# Patient Record
Sex: Male | Born: 1970 | Race: Black or African American | Hispanic: No | Marital: Married | Smoking: Current every day smoker
Health system: Southern US, Community
[De-identification: ages and names within clinical notes are randomized; demographics above are authoritative.]

## PROBLEM LIST (undated history)

## (undated) DIAGNOSIS — I10 Essential (primary) hypertension: Secondary | ICD-10-CM

---

## 2020-10-20 ENCOUNTER — Observation Stay (HOSPITAL_COMMUNITY): Payer: BC Managed Care – PPO

## 2020-10-20 ENCOUNTER — Encounter (HOSPITAL_COMMUNITY): Payer: Self-pay | Admitting: *Deleted

## 2020-10-20 ENCOUNTER — Observation Stay (HOSPITAL_COMMUNITY)
Admission: EM | Admit: 2020-10-20 | Discharge: 2020-10-22 | Disposition: A | Discharge status: Home / Self Care | Payer: BC Managed Care – PPO | Attending: Internal Medicine | Admitting: Internal Medicine

## 2020-10-20 ENCOUNTER — Other Ambulatory Visit: Payer: Self-pay

## 2020-10-20 DIAGNOSIS — E876 Hypokalemia: Secondary | ICD-10-CM | POA: Diagnosis not present

## 2020-10-20 DIAGNOSIS — E662 Morbid (severe) obesity with alveolar hypoventilation: Secondary | ICD-10-CM | POA: Diagnosis present

## 2020-10-20 DIAGNOSIS — F10230 Alcohol dependence with withdrawal, uncomplicated: Secondary | ICD-10-CM | POA: Diagnosis not present

## 2020-10-20 DIAGNOSIS — I4581 Long QT syndrome: Secondary | ICD-10-CM | POA: Insufficient documentation

## 2020-10-20 DIAGNOSIS — G4733 Obstructive sleep apnea (adult) (pediatric): Secondary | ICD-10-CM | POA: Diagnosis present

## 2020-10-20 DIAGNOSIS — E669 Obesity, unspecified: Secondary | ICD-10-CM | POA: Diagnosis present

## 2020-10-20 DIAGNOSIS — R202 Paresthesia of skin: Principal | ICD-10-CM | POA: Insufficient documentation

## 2020-10-20 DIAGNOSIS — R531 Weakness: Secondary | ICD-10-CM | POA: Diagnosis not present

## 2020-10-20 DIAGNOSIS — R197 Diarrhea, unspecified: Secondary | ICD-10-CM

## 2020-10-20 DIAGNOSIS — Z20822 Contact with and (suspected) exposure to covid-19: Secondary | ICD-10-CM | POA: Diagnosis not present

## 2020-10-20 DIAGNOSIS — I10 Essential (primary) hypertension: Secondary | ICD-10-CM | POA: Diagnosis not present

## 2020-10-20 DIAGNOSIS — Z79899 Other long term (current) drug therapy: Secondary | ICD-10-CM | POA: Diagnosis not present

## 2020-10-20 DIAGNOSIS — F17210 Nicotine dependence, cigarettes, uncomplicated: Secondary | ICD-10-CM | POA: Diagnosis not present

## 2020-10-20 DIAGNOSIS — R062 Wheezing: Secondary | ICD-10-CM | POA: Diagnosis not present

## 2020-10-20 DIAGNOSIS — R9431 Abnormal electrocardiogram [ECG] [EKG]: Secondary | ICD-10-CM | POA: Diagnosis present

## 2020-10-20 DIAGNOSIS — Z6836 Body mass index (BMI) 36.0-36.9, adult: Secondary | ICD-10-CM | POA: Insufficient documentation

## 2020-10-20 HISTORY — DX: Essential (primary) hypertension: I10

## 2020-10-20 LAB — HEPATIC FUNCTION PANEL
ALT: 13 U/L (ref 0–44)
AST: 22 U/L (ref 15–41)
Albumin: 3 g/dL — ABNORMAL LOW (ref 3.5–5.0)
Alkaline Phosphatase: 90 U/L (ref 38–126)
Bilirubin, Direct: 0.1 mg/dL (ref 0.0–0.2)
Indirect Bilirubin: 0.5 mg/dL (ref 0.3–0.9)
Total Bilirubin: 0.6 mg/dL (ref 0.3–1.2)
Total Protein: 6.6 g/dL (ref 6.5–8.1)

## 2020-10-20 LAB — CBC
HCT: 36.8 % — ABNORMAL LOW (ref 39.0–52.0)
Hemoglobin: 12.4 g/dL — ABNORMAL LOW (ref 13.0–17.0)
MCH: 36.2 pg — ABNORMAL HIGH (ref 26.0–34.0)
MCHC: 33.7 g/dL (ref 30.0–36.0)
MCV: 107.3 fL — ABNORMAL HIGH (ref 80.0–100.0)
Platelets: 248 10*3/uL (ref 150–400)
RBC: 3.43 MIL/uL — ABNORMAL LOW (ref 4.22–5.81)
RDW: 15.8 % — ABNORMAL HIGH (ref 11.5–15.5)
WBC: 8 10*3/uL (ref 4.0–10.5)
nRBC: 0.2 % (ref 0.0–0.2)

## 2020-10-20 LAB — BASIC METABOLIC PANEL
Anion gap: 14 (ref 5–15)
BUN: 15 mg/dL (ref 6–20)
CO2: 31 mmol/L (ref 22–32)
Calcium: 5.9 mg/dL — CL (ref 8.9–10.3)
Chloride: 94 mmol/L — ABNORMAL LOW (ref 98–111)
Creatinine, Ser: 1.08 mg/dL (ref 0.61–1.24)
GFR, Estimated: >60 mL/min (ref >60)
Glucose, Bld: 97 mg/dL (ref 70–99)
Potassium: 2.9 mmol/L — ABNORMAL LOW (ref 3.5–5.1)
Sodium: 139 mmol/L (ref 135–145)

## 2020-10-20 LAB — MAGNESIUM: Magnesium: 0.5 mg/dL — CL (ref 1.7–2.4)

## 2020-10-20 LAB — LIPASE, BLOOD: Lipase: 36 U/L (ref 11–51)

## 2020-10-20 MED ORDER — MAGNESIUM SULFATE 2 GM/50ML IV SOLN
2.0000 g | INTRAVENOUS | Status: AC
  Administered 2020-10-20: 2 g via INTRAVENOUS
  Filled 2020-10-20: qty 50

## 2020-10-20 MED ORDER — SODIUM CHLORIDE 0.9 % IV BOLUS
1000.0000 mL | Freq: Once | INTRAVENOUS | Status: AC
  Administered 2020-10-20: 1000 mL via INTRAVENOUS

## 2020-10-20 MED ORDER — CHLORDIAZEPOXIDE HCL 25 MG PO CAPS
25.0000 mg | ORAL_CAPSULE | Freq: Once | ORAL | Status: AC
  Administered 2020-10-20: 25 mg via ORAL
  Filled 2020-10-20: qty 1

## 2020-10-20 MED ORDER — CALCIUM GLUCONATE-NACL 2-0.675 GM/100ML-% IV SOLN
2.0000 g | Freq: Once | INTRAVENOUS | Status: AC
  Administered 2020-10-20: 2000 mg via INTRAVENOUS
  Filled 2020-10-20: qty 100

## 2020-10-20 MED ORDER — ALBUTEROL SULFATE HFA 108 (90 BASE) MCG/ACT IN AERS
4.0000 | INHALATION_SPRAY | Freq: Once | RESPIRATORY_TRACT | Status: AC
  Administered 2020-10-20: 4 via RESPIRATORY_TRACT
  Filled 2020-10-20: qty 6.7

## 2020-10-20 MED ORDER — LOPERAMIDE HCL 2 MG PO CAPS
4.0000 mg | ORAL_CAPSULE | Freq: Once | ORAL | Status: AC
  Administered 2020-10-20: 4 mg via ORAL
  Filled 2020-10-20: qty 2

## 2020-10-20 MED ORDER — POTASSIUM CHLORIDE 10 MEQ/100ML IV SOLN
10.0000 meq | INTRAVENOUS | Status: AC
  Administered 2020-10-20 (×3): 10 meq via INTRAVENOUS
  Filled 2020-10-20 (×3): qty 100

## 2020-10-20 NOTE — ED Notes (Signed)
ED TO INPATIENT HANDOFF REPORT  Name/Age/Gender Joel Campbell 50 y.o. male  Code Status   Home/SNF/Other Home  Chief Complaint Hypocalcemia [E83.51]  Level of Care/Admitting Diagnosis ED Disposition    ED Disposition Condition Comment   Admit  Hospital Area: [facility] [[id number]]  Level of Care: Progressive [102]  Admit to Progressive based on following criteria: MULTISYSTEM THREATS such as stable sepsis, metabolic/electrolyte imbalance with or without encephalopathy that is responding to early treatment.  Admit to Progressive based on following criteria: ACUTE MENTAL DISORDER-RELATED Drug/Alcohol Ingestion/Overdose/Withdrawal, Suicidal Ideation/attempt requiring safety sitter and < Q2h monitoring/assessments, moderate to severe agitation that is managed with medication/sitter, CIWA-Ar score < 20.  Covid Evaluation: Asymptomatic Screening Protocol (No Symptoms)  Diagnosis: Hypocalcemia [275.41.ICD-9-CM]  Admitting Physician: Marinda Elk [[id number]]  Attending Physician: Marinda Elk [[id number]]       Medical History Past Medical History:  Diagnosis Date  . Hypertension     Allergies Not on File  IV Location/Drains/Wounds Patient Lines/Drains/Airways Status    Active Line/Drains/Airways    Name Placement date Placement time Site Days   Peripheral IV 10/20/20 Right Antecubital 10/20/20  --  Antecubital  less than 1   Peripheral IV 10/20/20 Left Antecubital 10/20/20  1851  Antecubital  less than 1          Labs/Imaging Results for orders placed or performed during the hospital encounter of 10/20/20 (from the past 48 hour(s))  Basic metabolic panel     Status: Abnormal   Collection Time: 10/20/20  2:17 PM  Result Value Ref Range   Sodium 139 135 - 145 mmol/L   Potassium 2.9 (L) 3.5 - 5.1 mmol/L   Chloride 94 (L) 98 - 111 mmol/L   CO2 31 22 - 32 mmol/L   Glucose, Bld 97 70 - 99 mg/dL    Comment: Glucose reference range applies only to  samples taken after fasting for at least 8 hours.   BUN 15 6 - 20 mg/dL   Creatinine, Ser 0.96 0.61 - 1.24 mg/dL   Calcium 5.9 (LL) 8.9 - 10.3 mg/dL    Comment: CRITICAL RESULT CALLED TO, READ BACK BY AND VERIFIED WITH: S.WEST, RN AT 1637 ON 03.17.22 BY N.THOMPSON    GFR, Estimated >60 >60 mL/min    Comment: (NOTE) Calculated using the CKD-EPI Creatinine Equation (2021)    Anion gap 14 5 - 15    Comment: Performed at [facility], [address] [place], [place] [address]  CBC     Status: Abnormal   Collection Time: 10/20/20  2:17 PM  Result Value Ref Range   WBC 8.0 4.0 - 10.5 K/uL   RBC 3.43 (L) 4.22 - 5.81 MIL/uL   Hemoglobin 12.4 (L) 13.0 - 17.0 g/dL   HCT 29.4 (L) 76.5 - 46.5 %   MCV 107.3 (H) 80.0 - 100.0 fL   MCH 36.2 (H) 26.0 - 34.0 pg   MCHC 33.7 30.0 - 36.0 g/dL   RDW 03.5 (H) 46.5 - 68.1 %   Platelets 248 150 - 400 K/uL   nRBC 0.2 0.0 - 0.2 %    Comment: Performed at [facility], [address] [place], [place] [address]  Lipase, blood     Status: None   Collection Time: 10/20/20  6:18 PM  Result Value Ref Range   Lipase 36 11 - 51 U/L    Comment: Performed at [facility], [address], [place], [place] [address]  Magnesium  Status: Abnormal   Collection Time: 10/20/20  6:18 PM  Result Value Ref Range   Magnesium 0.5 (LL) 1.7 - 2.4 mg/dL    Comment: CRITICAL RESULT CALLED TO, READ BACK BY AND VERIFIED WITH: B.BROOKS, RN AT 1940 ON 03.17.22 BY N.THOMPSON Performed at [facility], [address], [place], [place] [address]   Hepatic function panel     Status: Abnormal   Collection Time: 10/20/20  6:18 PM  Result Value Ref Range   Total Protein 6.6 6.5 - 8.1 g/dL   Albumin 3.0 (L) 3.5 - 5.0 g/dL   AST 22 15 - 41 U/L   ALT 13 0 - 44 U/L   Alkaline Phosphatase 90 38 - 126 U/L   Total Bilirubin 0.6 0.3 - 1.2 mg/dL   Bilirubin, Direct 0.1 0.0 - 0.2 mg/dL   Indirect Bilirubin 0.5  0.3 - 0.9 mg/dL    Comment: Performed at [facility], [address] [place], [place] [address]   DG Chest Port 1 View  Result Date: 10/20/2020 CLINICAL DATA:  Arm and leg paresthesias. EXAM: PORTABLE CHEST 1 VIEW COMPARISON:  None. FINDINGS: The heart size and mediastinal contours are within normal limits. Both lungs are clear. The visualized skeletal structures are unremarkable. IMPRESSION: No active disease. Electronically Signed   By: Aram Candela M.D.   On: 10/20/2020 19:57    Pending Labs Unresulted Labs (From admission, onward)          Start     Ordered   10/20/20 1941  Brain natriuretic peptide  Add-on,   AD        10/20/20 1940   10/20/20 1941  PTH, intact and calcium  Add-on,   AD       Question:  Release to patient  Answer:  Immediate   10/20/20 1940   10/20/20 1939  SARS CORONAVIRUS 2 (TAT 6-24 HRS) Nasopharyngeal Nasopharyngeal Swab  (Tier 3 - Symptomatic/asymptomatic with Precautions)  Once,   STAT       Question Answer Comment  Is this test for diagnosis or screening Screening   Symptomatic for COVID-19 as defined by CDC No   Hospitalized for COVID-19 No   Admitted to ICU for COVID-19 No   Previously tested for COVID-19 No   Resident in a congregate (group) care setting Unknown   Employed in healthcare setting Unknown   Has patient completed COVID vaccination(s) (2 doses of Pfizer/Moderna 1 dose of [employer]) Unknown      10/20/20 1938          Vitals/Pain Today's Vitals   10/20/20 1900 10/20/20 1915 10/20/20 1937 10/20/20 2030  BP: 97/69  (!) 116/55 125/63  Pulse: 78 81 75 81  Resp: (!) 21 (!) 31 12 18   Temp:      TempSrc:      SpO2: (!) 88% 95% 96% 90%  Weight:      Height:      PainSc:        Isolation Precautions No active isolations  Medications Medications  potassium chloride 10 mEq in 100 mL IVPB (10 mEq Intravenous New Bag/Given 10/20/20 2018)  calcium gluconate 2 g/ 100 mL sodium chloride IVPB (0 g  Intravenous Stopped 10/20/20 2050)  magnesium sulfate IVPB 2 g 50 mL (0 g Intravenous Stopped 10/20/20 2013)  sodium chloride 0.9 % bolus 1,000 mL (0 mLs Intravenous Stopped 10/20/20 2013)  albuterol (VENTOLIN HFA) 108 (90 Base) MCG/ACT inhaler 4 puff (4 puffs Inhalation Given  10/20/20 1859)  chlordiazePOXIDE (LIBRIUM) capsule 25 mg (25 mg Oral Given 10/20/20 1853)  loperamide (IMODIUM) capsule 4 mg (4 mg Oral Given 10/20/20 1853)    Mobility walks with person assist

## 2020-10-20 NOTE — ED Provider Notes (Signed)
[facility]-EMERGENCY DEPT Provider Note   CSN: [id number] Arrival date & time: 10/20/20  1400     History Chief Complaint  Patient presents with   Tingling    Joel Campbell is a 50 y.o. male with a past medical history of alcohol abuse.  The patient began having migrating areas of paresthesia in his legs and arms.  This is consistent with previous episodes of severe electrolyte abnormalities that he has has in the past.  Patient reports that when he has been drinking heavily he gets severe diarrhea which leads to this.  He is currently admitted to [facility] since 10/19/2020.  He continues to have diarrhea.  He is on a Librium taper and has had no episodes, signs or symptoms of withdrawal since then.  The patient has been hospitalized twice before for severe electrolyte disturbances with paresthesia.  He denies cramping, changes in mentation or weakness.  He denies abdominal pain or vomiting.  HPI     Past Medical History:  Diagnosis Date   Hypertension     Patient Active Problem List   Diagnosis Date Noted   Hypokalemia 10/21/2020   Hypomagnesemia 10/21/2020   Alcohol dependence with uncomplicated withdrawal (HCC) 10/21/2020   OSA (obstructive sleep apnea) 10/21/2020   Essential hypertension 10/21/2020   Nicotine dependence, cigarettes, uncomplicated 10/21/2020   Wheezing 10/21/2020   Prolonged QT interval 10/21/2020   Hypocalcemia 10/20/2020    History reviewed. No pertinent surgical history.     Family History  Problem Relation Age of Onset   Heart attack Neg Hx     Social History   Tobacco Use   Smoking status: Current Every Day Smoker    Types: Cigarettes    Start date: 08/2000   Smokeless tobacco: Never Used  Substance Use Topics   Alcohol use: Yes    Home Medications Prior to Admission medications   Medication Sig Start Date End Date Taking? Authorizing Provider  allopurinol (ZYLOPRIM) 300 MG tablet Take 300 mg  by mouth daily. 08/12/20  Yes [provider]  amLODipine (NORVASC) 5 MG tablet Take 5 mg by mouth daily. 08/10/20  Yes [provider]  Calcium Carb-Cholecalciferol (OYSTER SHELL CALCIUM W/D) 500-200 MG-UNIT TABS Take 2 tablets by mouth 2 (two) times daily. 09/21/20  Yes [provider]  chlordiazePOXIDE (LIBRIUM) 25 MG capsule Take 25 mg by mouth 3 (three) times daily as needed for anxiety.   Yes [provider]  cloNIDine (CATAPRES) 0.1 MG tablet Take 0.1 mg by mouth 2 (two) times daily. 09/05/20  Yes [provider]  clotrimazole-betamethasone (LOTRISONE) cream APPLY TO AFFECTED AREA TWICE A DAY 09/08/20  Yes [provider]  FLUoxetine (PROZAC) 20 MG capsule Take 20 mg by mouth daily. 09/09/20  Yes [provider]  hydrALAZINE (APRESOLINE) 25 MG tablet Take 25 mg by mouth 3 (three) times daily. 07/01/20  Yes [provider]  isosorbide dinitrate (ISORDIL) 20 MG tablet Take 20 mg by mouth 3 (three) times daily. 07/04/20  Yes [provider]  KLOR-CON M20 20 MEQ tablet Take 40 mEq by mouth daily. 08/10/20  Yes [provider]  magnesium oxide (MAG-OX) 400 MG tablet Take 2 tablets by mouth daily. 09/21/20  Yes [provider]  metoprolol (TOPROL-XL) 200 MG 24 hr tablet Take 200 mg by mouth daily. 09/09/20  Yes [provider]  Multiple Vitamins-Minerals (MULTIVITAMIN ADULTS PO) Take 1 capsule by mouth daily.   Yes [provider]  omeprazole (PRILOSEC) 20 MG  capsule Take 20 mg by mouth daily. 08/09/20  Yes [provider]  thiamine (VITAMIN B-1) 100 MG tablet Take 100 mg by mouth daily.   Yes [provider]  traZODone (DESYREL) 50 MG tablet Take 50 mg by mouth at bedtime.   Yes [provider]    Allergies    Lisinopril  Review of Systems   Review of Systems Ten systems reviewed and are negative for acute change, except as noted in the HPI.   Physical  Exam Updated Vital Signs BP (!) 153/84    Pulse 76    Temp 98.1 F (36.7 C) (Oral)    Resp 20    Ht 6' (1.829 m)    Wt 122.5 kg    SpO2 97%    BMI 36.63 kg/m   Physical Exam Vitals and nursing note reviewed.  Constitutional:      General: He is not in acute distress.    Appearance: He is well-developed. He is not diaphoretic.  HENT:     Head: Normocephalic and atraumatic.  Eyes:     General: No scleral icterus.    Conjunctiva/sclera: Conjunctivae normal.  Cardiovascular:     Rate and Rhythm: Normal rate and regular rhythm.     Heart sounds: Normal heart sounds.  Pulmonary:     Effort: Pulmonary effort is normal. No respiratory distress.     Breath sounds: Wheezing present.  Abdominal:     Palpations: Abdomen is soft.     Tenderness: There is no abdominal tenderness.  Musculoskeletal:     Cervical back: Normal range of motion and neck supple.     Right lower leg: Edema present.     Left lower leg: Edema present.  Skin:    General: Skin is warm and dry.  Neurological:     Mental Status: He is alert.  Psychiatric:        Behavior: Behavior normal.     ED Results / Procedures / Treatments   Labs (all labs ordered are listed, but only abnormal results are displayed) Labs Reviewed  BASIC METABOLIC PANEL - Abnormal; Notable for the following components:      Result Value   Potassium 2.9 (*)    Chloride 94 (*)    Calcium 5.9 (*)    All other components within normal limits  CBC - Abnormal; Notable for the following components:   RBC 3.43 (*)    Hemoglobin 12.4 (*)    HCT 36.8 (*)    MCV 107.3 (*)    MCH 36.2 (*)    RDW 15.8 (*)    All other components within normal limits  MAGNESIUM - Abnormal; Notable for the following components:   Magnesium 0.5 (*)    All other components within normal limits  HEPATIC FUNCTION PANEL - Abnormal; Notable for the following components:   Albumin 3.0 (*)    All other components within normal limits  VITAMIN D 25 HYDROXY (VIT D  DEFICIENCY, FRACTURES) - Abnormal; Notable for the following components:   Vit D, 25-Hydroxy 11.99 (*)    All other components within normal limits  COMPREHENSIVE METABOLIC PANEL - Abnormal; Notable for the following components:   Potassium 3.0 (*)    Chloride 96 (*)    Glucose, Bld 107 (*)    Calcium 6.3 (*)    Albumin 3.2 (*)    All other components within normal limits  MAGNESIUM - Abnormal; Notable for the following components:   Magnesium 1.2 (*)  All other components within normal limits  PHOSPHORUS - Abnormal; Notable for the following components:   Phosphorus 5.0 (*)    All other components within normal limits  CBC WITH DIFFERENTIAL/PLATELET - Abnormal; Notable for the following components:   RBC 3.35 (*)    Hemoglobin 11.9 (*)    HCT 35.8 (*)    MCV 106.9 (*)    MCH 35.5 (*)    RDW 15.9 (*)    All other components within normal limits  MAGNESIUM - Abnormal; Notable for the following components:   Magnesium 1.5 (*)    All other components within normal limits  CALCIUM - Abnormal; Notable for the following components:   Calcium 6.5 (*)    All other components within normal limits  SARS CORONAVIRUS 2 (TAT 6-24 HRS)  LIPASE, BLOOD  BRAIN NATRIURETIC PEPTIDE  HIV ANTIBODY (ROUTINE TESTING W REFLEX)  PTH, INTACT AND CALCIUM  I-STAT CHEM 8, ED    EKG EKG Interpretation  Date/Time:  Thursday October 20 2020 18:14:06 EDT Ventricular Rate:  81 PR Interval:    QRS Duration: 99 QT Interval:  440 QTC Calculation: 511 R Axis:   60 Text Interpretation: Sinus rhythm Prolonged QT interval No STEMI Confirmed by Alvester Chou [phone]) on 10/20/2020 6:22:01 PM   Radiology DG Chest Port 1 View  Result Date: 10/20/2020 CLINICAL DATA:  Arm and leg paresthesias. EXAM: PORTABLE CHEST 1 VIEW COMPARISON:  None. FINDINGS: The heart size and mediastinal contours are within normal limits. Both lungs are clear. The visualized skeletal structures are unremarkable. IMPRESSION: No  active disease. Electronically Signed   By: Aram Candela M.D.   On: 10/20/2020 19:57    Procedures .Critical Care Performed by: Arthor Captain, PA-C Authorized by: Arthor Captain, PA-C   Critical care provider statement:    Critical care time (minutes):  50   Critical care time was exclusive of:  Separately billable procedures and treating other patients   Critical care was necessary to treat or prevent imminent or life-threatening deterioration of the following conditions:  Metabolic crisis   Critical care was time spent personally by me on the following activities:  Discussions with consultants, evaluation of patient's response to treatment, examination of patient, ordering and performing treatments and interventions, ordering and review of laboratory studies, ordering and review of radiographic studies, pulse oximetry, re-evaluation of patient's condition, obtaining history from patient or surrogate and review of old charts     Medications Ordered in ED Medications  albuterol (PROVENTIL) (2.5 MG/3ML) 0.083% nebulizer solution 2.5 mg (2.5 mg Nebulization Given 10/21/20 0335)  chlordiazePOXIDE (LIBRIUM) capsule 10 mg (10 mg Oral Given 10/21/20 1338)  chlordiazePOXIDE (LIBRIUM) capsule 5 mg (has no administration in time range)  LORazepam (ATIVAN) tablet 1-4 mg (has no administration in time range)    Or  LORazepam (ATIVAN) injection 1-4 mg (has no administration in time range)  thiamine tablet 100 mg (100 mg Oral Given 10/21/20 1008)    Or  thiamine (B-1) injection 100 mg ( Intravenous See Alternative 10/21/20 1008)  folic acid (FOLVITE) tablet 1 mg (1 mg Oral Given 10/21/20 1009)  multivitamin with minerals tablet 1 tablet (1 tablet Oral Given 10/21/20 1008)  amLODipine (NORVASC) tablet 5 mg (5 mg Oral Given 10/21/20 1015)  cloNIDine (CATAPRES) tablet 0.1 mg (0.1 mg Oral Given 10/21/20 1018)  hydrALAZINE (APRESOLINE) tablet 25 mg (25 mg Oral Given 10/21/20 1755)  isosorbide dinitrate  (ISORDIL) tablet 20 mg (20 mg Oral Given 10/21/20 1754)  metoprolol succinate (TOPROL-XL) 24  hr tablet 200 mg (200 mg Oral Not Given 10/21/20 1018)  FLUoxetine (PROZAC) capsule 20 mg (20 mg Oral Given 10/21/20 1009)  traZODone (DESYREL) tablet 50 mg (has no administration in time range)  pantoprazole (PROTONIX) EC tablet 40 mg (40 mg Oral Given 10/21/20 1008)  hydrALAZINE (APRESOLINE) injection 10 mg (has no administration in time range)  enoxaparin (LOVENOX) injection 60 mg (60 mg Subcutaneous Given 10/21/20 1009)  acetaminophen (TYLENOL) tablet 650 mg (has no administration in time range)    Or  acetaminophen (TYLENOL) suppository 650 mg (has no administration in time range)  polyethylene glycol (MIRALAX / GLYCOLAX) packet 17 g (has no administration in time range)  potassium chloride SA (KLOR-CON) CR tablet 40 mEq (40 mEq Oral Given 10/21/20 1008)  calcium gluconate 3 g in sodium chloride 0.9 % 100 mL IVPB (3 g Intravenous New Bag/Given 10/21/20 1754)  calcium gluconate 2 g/ 100 mL sodium chloride IVPB (0 g Intravenous Stopped 10/20/20 2050)  potassium chloride 10 mEq in 100 mL IVPB (0 mEq Intravenous Stopped 10/21/20 0000)  magnesium sulfate IVPB 2 g 50 mL (0 g Intravenous Stopped 10/20/20 2013)  sodium chloride 0.9 % bolus 1,000 mL (0 mLs Intravenous Stopped 10/20/20 2013)  albuterol (VENTOLIN HFA) 108 (90 Base) MCG/ACT inhaler 4 puff (4 puffs Inhalation Given 10/20/20 1859)  chlordiazePOXIDE (LIBRIUM) capsule 25 mg (25 mg Oral Given 10/20/20 1853)  loperamide (IMODIUM) capsule 4 mg (4 mg Oral Given 10/20/20 1853)  magnesium sulfate IVPB 2 g 50 mL (0 g Intravenous Stopping Infusion hung by another clincian 10/21/20 0844)  potassium chloride SA (KLOR-CON) CR tablet 40 mEq (40 mEq Oral Given 10/21/20 0254)  chlordiazePOXIDE (LIBRIUM) capsule 25 mg (25 mg Oral Given 10/21/20 0351)  calcium gluconate 2 g/ 100 mL sodium chloride IVPB (0 mg Intravenous Stopped 10/21/20 1329)  magnesium sulfate IVPB 2 g 50 mL (0  g Intravenous Stopping Infusion hung by another clincian 10/21/20 0844)  magnesium sulfate IVPB 2 g 50 mL (0 g Intravenous Stopped 10/21/20 1328)  calcium gluconate 1 g/ 50 mL sodium chloride IVPB (0 mg Intravenous Stopped 10/21/20 1328)  magnesium sulfate IVPB 4 g 100 mL (4 g Intravenous New Bag/Given 10/21/20 1339)    ED Course  I have reviewed the triage vital signs and the nursing notes.  Pertinent labs & imaging results that were available during my care of the patient were reviewed by me and considered in my medical decision making (see chart for details).  Clinical Course as of 10/21/20 1827  Thu Oct 20, 2020  1807 Librium Taper is as follows 3/17- 25mg  5:00p, 10:00p 3/18 -25 mg 8:00a, 12:00p; 10mg  5p,10p 3/19- 10mg  8a/12p; 5mg   5p/10p 3/20- 5mg  8a/ 12p  [AH]  Fri Oct 21, 2020  1827 MAP (mmHg): 75 [AH]    Clinical Course User Index [AH] , PA-C   MDM Rules/Calculators/A&P                          Patient here with hx of ETOH abuse,  Final Clinical Impression(s) / ED Diagnoses Final diagnoses:  Paresthesia  Hypocalcemia  Hypokalemia  Prolonged Q-T interval on ECG  Diarrhea, unspecified type   CC: paresthesia VS:  10/20/20 1411 98 F (36.7 C) 86 18 124/62 91 % --    is gathered by patient and EMR, MAR and paperwork sent by [facility] And phone call with [facility] staff. Previous records obtained and reviewed. DDX:The patient's complaint of paresthesia  involves an extensive number of diagnostic and treatment options, and is a complaint that carries with it a high risk of complications, morbidity, and potential mortality. Given the large differential diagnosis, medical decision making is of high complexity. The differential diagnosis of paresthesias includes but is not limited XB:JYNWGNFAOZto:Alcoholism, diabetic neuropathy, es mellitus, entrapment neuropathy, eg, carpal tunnel syndrome, tarsal tunnel syndrome, meralgia paresthetica) hypocalcemia, multiple  sclerosis, spinal cord lesion, nerve root compression, herpes zoster, transient ischemic attack, Guillain-Barr syndrome, trigeminal neuralgia, migraine, partial seizure, reflex sympathetic dystrophy, thoracic outlet syndrome, brachial plexus neuropathy. Labs: I ordered reviewed and interpreted labs which include CBC-macrocytic anemia Bmp- K of 2.9 and severe low Ca at 5.9  Lipase wnl Magnesium- Severely low at 0.5  Hepatic fx panel- pending Imaging: pendng cxr. EKG:nsr with prolonged QTc of 511  Consults:Dr. Shaloub for admission  HYQ:MVHQIONDM:patient here with SEVERE  Electrolyte derangement and paresthesia. I have ordered Calcium gluconate, magnesium, and potassium IV for  Correction and oral imodium for continued [employer].eediarrhea.mild hypoxia with wheezing- copd v. edema- I have ordered albuterol. I have continued librium taper. Patient has no evidence of sxs of withdrawal. Patient will be admitted.  Patient disposition:The patient appears reasonably stabilized for admission considering the current resources, flow, and capabilities available in the ED at this time, and I doubt any other [facility] requiring further screening and/or treatment in the ED prior to admission.        Rx / DC Orders ED Discharge Orders    None       Arthor CaptainHarris, Daily Doe, PA-C 10/21/20 1835    Terald Sleeperrifan, Matthew J, MD 10/22/20 1249

## 2020-10-20 NOTE — ED Triage Notes (Signed)
Per EMS, pt from [facility] x 2 days. Over last 3 days has been experiencing paresthesia in arms and legs-- numbness, tingling. He states symptoms feel like he has a problem with electrolytes and needs to go to hospital.  BP 80/46. Given 500cc bolus. Now 130/80 HR 88 RR 16 SpO2 98% CBG 90 Temp 98.3

## 2020-10-20 NOTE — Plan of Care (Signed)

## 2020-10-21 ENCOUNTER — Encounter (HOSPITAL_COMMUNITY): Payer: Self-pay | Admitting: Internal Medicine

## 2020-10-21 DIAGNOSIS — E662 Morbid (severe) obesity with alveolar hypoventilation: Secondary | ICD-10-CM

## 2020-10-21 DIAGNOSIS — R197 Diarrhea, unspecified: Secondary | ICD-10-CM

## 2020-10-21 DIAGNOSIS — E876 Hypokalemia: Secondary | ICD-10-CM | POA: Diagnosis not present

## 2020-10-21 DIAGNOSIS — R062 Wheezing: Secondary | ICD-10-CM

## 2020-10-21 DIAGNOSIS — F10230 Alcohol dependence with withdrawal, uncomplicated: Secondary | ICD-10-CM | POA: Diagnosis not present

## 2020-10-21 DIAGNOSIS — I10 Essential (primary) hypertension: Secondary | ICD-10-CM | POA: Diagnosis not present

## 2020-10-21 DIAGNOSIS — R9431 Abnormal electrocardiogram [ECG] [EKG]: Secondary | ICD-10-CM

## 2020-10-21 DIAGNOSIS — R202 Paresthesia of skin: Principal | ICD-10-CM

## 2020-10-21 DIAGNOSIS — G4733 Obstructive sleep apnea (adult) (pediatric): Secondary | ICD-10-CM

## 2020-10-21 DIAGNOSIS — Z6836 Body mass index (BMI) 36.0-36.9, adult: Secondary | ICD-10-CM

## 2020-10-21 DIAGNOSIS — F17210 Nicotine dependence, cigarettes, uncomplicated: Secondary | ICD-10-CM | POA: Diagnosis present

## 2020-10-21 DIAGNOSIS — E669 Obesity, unspecified: Secondary | ICD-10-CM | POA: Diagnosis present

## 2020-10-21 LAB — CBC WITH DIFFERENTIAL/PLATELET
Abs Immature Granulocytes: 0.04 10*3/uL (ref 0.00–0.07)
Basophils Absolute: 0 10*3/uL (ref 0.0–0.1)
Basophils Relative: 1 %
Eosinophils Absolute: 0.2 10*3/uL (ref 0.0–0.5)
Eosinophils Relative: 2 %
HCT: 35.8 % — ABNORMAL LOW (ref 39.0–52.0)
Hemoglobin: 11.9 g/dL — ABNORMAL LOW (ref 13.0–17.0)
Immature Granulocytes: 1 %
Lymphocytes Relative: 25 %
Lymphs Abs: 1.8 10*3/uL (ref 0.7–4.0)
MCH: 35.5 pg — ABNORMAL HIGH (ref 26.0–34.0)
MCHC: 33.2 g/dL (ref 30.0–36.0)
MCV: 106.9 fL — ABNORMAL HIGH (ref 80.0–100.0)
Monocytes Absolute: 0.4 10*3/uL (ref 0.1–1.0)
Monocytes Relative: 5 %
Neutro Abs: 4.9 10*3/uL (ref 1.7–7.7)
Neutrophils Relative %: 66 %
Platelets: 226 10*3/uL (ref 150–400)
RBC: 3.35 MIL/uL — ABNORMAL LOW (ref 4.22–5.81)
RDW: 15.9 % — ABNORMAL HIGH (ref 11.5–15.5)
WBC: 7.3 10*3/uL (ref 4.0–10.5)
nRBC: 0 % (ref 0.0–0.2)

## 2020-10-21 LAB — COMPREHENSIVE METABOLIC PANEL
ALT: 12 U/L (ref 0–44)
AST: 20 U/L (ref 15–41)
Albumin: 3.2 g/dL — ABNORMAL LOW (ref 3.5–5.0)
Alkaline Phosphatase: 86 U/L (ref 38–126)
Anion gap: 12 (ref 5–15)
BUN: 13 mg/dL (ref 6–20)
CO2: 31 mmol/L (ref 22–32)
Calcium: 6.3 mg/dL — CL (ref 8.9–10.3)
Chloride: 96 mmol/L — ABNORMAL LOW (ref 98–111)
Creatinine, Ser: 0.87 mg/dL (ref 0.61–1.24)
GFR, Estimated: >60 mL/min (ref >60)
Glucose, Bld: 107 mg/dL — ABNORMAL HIGH (ref 70–99)
Potassium: 3 mmol/L — ABNORMAL LOW (ref 3.5–5.1)
Sodium: 139 mmol/L (ref 135–145)
Total Bilirubin: 0.8 mg/dL (ref 0.3–1.2)
Total Protein: 6.6 g/dL (ref 6.5–8.1)

## 2020-10-21 LAB — MAGNESIUM
Magnesium: 1.2 mg/dL — ABNORMAL LOW (ref 1.7–2.4)
Magnesium: 1.5 mg/dL — ABNORMAL LOW (ref 1.7–2.4)

## 2020-10-21 LAB — VITAMIN D 25 HYDROXY (VIT D DEFICIENCY, FRACTURES): Vit D, 25-Hydroxy: 11.99 ng/mL — ABNORMAL LOW (ref 30–100)

## 2020-10-21 LAB — PHOSPHORUS: Phosphorus: 5 mg/dL — ABNORMAL HIGH (ref 2.5–4.6)

## 2020-10-21 LAB — SARS CORONAVIRUS 2 (TAT 6-24 HRS): SARS Coronavirus 2: NEGATIVE

## 2020-10-21 LAB — BRAIN NATRIURETIC PEPTIDE: B Natriuretic Peptide: 41.1 pg/mL (ref 0.0–100.0)

## 2020-10-21 LAB — CALCIUM: Calcium: 6.5 mg/dL — ABNORMAL LOW (ref 8.9–10.3)

## 2020-10-21 LAB — HIV ANTIBODY (ROUTINE TESTING W REFLEX): HIV Screen 4th Generation wRfx: NONREACTIVE

## 2020-10-21 MED ORDER — CHLORDIAZEPOXIDE HCL 5 MG PO CAPS
25.0000 mg | ORAL_CAPSULE | Freq: Once | ORAL | Status: AC
  Administered 2020-10-21: 25 mg via ORAL
  Filled 2020-10-21: qty 5

## 2020-10-21 MED ORDER — FOLIC ACID 1 MG PO TABS
1.0000 mg | ORAL_TABLET | Freq: Every day | ORAL | Status: DC
  Administered 2020-10-21 – 2020-10-22 (×2): 1 mg via ORAL
  Filled 2020-10-21 (×2): qty 1

## 2020-10-21 MED ORDER — ISOSORBIDE DINITRATE 20 MG PO TABS
20.0000 mg | ORAL_TABLET | Freq: Three times a day (TID) | ORAL | Status: DC
  Administered 2020-10-21 – 2020-10-22 (×5): 20 mg via ORAL
  Filled 2020-10-21 (×6): qty 1

## 2020-10-21 MED ORDER — SODIUM CHLORIDE 0.9 % IV SOLN
3.0000 g | Freq: Once | INTRAVENOUS | Status: AC
  Administered 2020-10-21: 3 g via INTRAVENOUS
  Filled 2020-10-21: qty 30

## 2020-10-21 MED ORDER — HYDRALAZINE HCL 25 MG PO TABS
25.0000 mg | ORAL_TABLET | Freq: Three times a day (TID) | ORAL | Status: DC
  Administered 2020-10-21 – 2020-10-22 (×5): 25 mg via ORAL
  Filled 2020-10-21 (×5): qty 1

## 2020-10-21 MED ORDER — HYDRALAZINE HCL 20 MG/ML IJ SOLN: 10.0000 mg | Freq: Four times a day (QID) | INTRAMUSCULAR | Status: DC | PRN

## 2020-10-21 MED ORDER — MAGNESIUM SULFATE 2 GM/50ML IV SOLN
2.0000 g | Freq: Once | INTRAVENOUS | Status: AC
  Administered 2020-10-21: 2 g via INTRAVENOUS
  Filled 2020-10-21: qty 50

## 2020-10-21 MED ORDER — PANTOPRAZOLE SODIUM 40 MG PO TBEC
40.0000 mg | DELAYED_RELEASE_TABLET | Freq: Every day | ORAL | Status: DC
  Administered 2020-10-21 – 2020-10-22 (×2): 40 mg via ORAL
  Filled 2020-10-21 (×2): qty 1

## 2020-10-21 MED ORDER — THIAMINE HCL 100 MG PO TABS
100.0000 mg | ORAL_TABLET | Freq: Every day | ORAL | Status: DC
  Administered 2020-10-21 – 2020-10-22 (×2): 100 mg via ORAL
  Filled 2020-10-21 (×2): qty 1

## 2020-10-21 MED ORDER — ACETAMINOPHEN 650 MG RE SUPP: 650.0000 mg | Freq: Four times a day (QID) | RECTAL | Status: DC | PRN

## 2020-10-21 MED ORDER — POTASSIUM CHLORIDE CRYS ER 20 MEQ PO TBCR
40.0000 meq | EXTENDED_RELEASE_TABLET | Freq: Two times a day (BID) | ORAL | Status: DC
  Administered 2020-10-21 – 2020-10-22 (×3): 40 meq via ORAL
  Filled 2020-10-21 (×3): qty 2

## 2020-10-21 MED ORDER — ALBUTEROL SULFATE (2.5 MG/3ML) 0.083% IN NEBU
2.5000 mg | INHALATION_SOLUTION | RESPIRATORY_TRACT | Status: DC | PRN
  Administered 2020-10-21: 2.5 mg via RESPIRATORY_TRACT
  Filled 2020-10-21: qty 3

## 2020-10-21 MED ORDER — ENOXAPARIN SODIUM 60 MG/0.6ML ~~LOC~~ SOLN
60.0000 mg | SUBCUTANEOUS | Status: DC
  Administered 2020-10-21 – 2020-10-22 (×2): 60 mg via SUBCUTANEOUS
  Filled 2020-10-21 (×2): qty 0.6

## 2020-10-21 MED ORDER — CALCIUM GLUCONATE-NACL 1-0.675 GM/50ML-% IV SOLN
1.0000 g | Freq: Once | INTRAVENOUS | Status: AC
  Administered 2020-10-21: 1000 mg via INTRAVENOUS
  Filled 2020-10-21: qty 50

## 2020-10-21 MED ORDER — CHLORDIAZEPOXIDE HCL 5 MG PO CAPS
10.0000 mg | ORAL_CAPSULE | Freq: Four times a day (QID) | ORAL | Status: AC
  Administered 2020-10-21 – 2020-10-22 (×5): 10 mg via ORAL
  Filled 2020-10-21 (×5): qty 2

## 2020-10-21 MED ORDER — POLYETHYLENE GLYCOL 3350 17 G PO PACK: 17.0000 g | PACK | Freq: Every day | ORAL | Status: DC | PRN

## 2020-10-21 MED ORDER — CLONIDINE HCL 0.1 MG PO TABS
0.1000 mg | ORAL_TABLET | Freq: Two times a day (BID) | ORAL | Status: DC
  Administered 2020-10-21 – 2020-10-22 (×3): 0.1 mg via ORAL
  Filled 2020-10-21 (×3): qty 1

## 2020-10-21 MED ORDER — FLUOXETINE HCL 20 MG PO CAPS
20.0000 mg | ORAL_CAPSULE | Freq: Every day | ORAL | Status: DC
  Administered 2020-10-21 – 2020-10-22 (×2): 20 mg via ORAL
  Filled 2020-10-21 (×2): qty 1

## 2020-10-21 MED ORDER — LORAZEPAM 1 MG PO TABS
1.0000 mg | ORAL_TABLET | ORAL | Status: DC | PRN
  Administered 2020-10-21: 2 mg via ORAL
  Filled 2020-10-21: qty 2

## 2020-10-21 MED ORDER — ACETAMINOPHEN 325 MG PO TABS: 650.0000 mg | ORAL_TABLET | Freq: Four times a day (QID) | ORAL | Status: DC | PRN

## 2020-10-21 MED ORDER — MAGNESIUM SULFATE 4 GM/100ML IV SOLN
4.0000 g | Freq: Once | INTRAVENOUS | Status: AC
  Administered 2020-10-21: 4 g via INTRAVENOUS
  Filled 2020-10-21: qty 100

## 2020-10-21 MED ORDER — AMLODIPINE BESYLATE 5 MG PO TABS
5.0000 mg | ORAL_TABLET | Freq: Every day | ORAL | Status: DC
  Administered 2020-10-21 – 2020-10-22 (×2): 5 mg via ORAL
  Filled 2020-10-21 (×2): qty 1

## 2020-10-21 MED ORDER — CHLORDIAZEPOXIDE HCL 5 MG PO CAPS: 5.0000 mg | ORAL_CAPSULE | Freq: Four times a day (QID) | ORAL | Status: DC

## 2020-10-21 MED ORDER — METOPROLOL SUCCINATE ER 100 MG PO TB24
200.0000 mg | ORAL_TABLET | Freq: Every day | ORAL | Status: DC
  Administered 2020-10-22: 200 mg via ORAL
  Filled 2020-10-21 (×2): qty 2

## 2020-10-21 MED ORDER — CALCIUM GLUCONATE-NACL 2-0.675 GM/100ML-% IV SOLN
2.0000 g | Freq: Once | INTRAVENOUS | Status: AC
  Administered 2020-10-21: 2000 mg via INTRAVENOUS
  Filled 2020-10-21: qty 100

## 2020-10-21 MED ORDER — ADULT MULTIVITAMIN W/MINERALS CH
1.0000 | ORAL_TABLET | Freq: Every day | ORAL | Status: DC
  Administered 2020-10-21 – 2020-10-22 (×2): 1 via ORAL
  Filled 2020-10-21 (×2): qty 1

## 2020-10-21 MED ORDER — TRAZODONE HCL 50 MG PO TABS
50.0000 mg | ORAL_TABLET | Freq: Every day | ORAL | Status: DC
  Administered 2020-10-21: 50 mg via ORAL
  Filled 2020-10-21: qty 1

## 2020-10-21 MED ORDER — POTASSIUM CHLORIDE CRYS ER 20 MEQ PO TBCR
40.0000 meq | EXTENDED_RELEASE_TABLET | Freq: Once | ORAL | Status: AC
  Administered 2020-10-21: 40 meq via ORAL
  Filled 2020-10-21: qty 2

## 2020-10-21 MED ORDER — LORAZEPAM 2 MG/ML IJ SOLN: 1.0000 mg | INTRAMUSCULAR | Status: DC | PRN

## 2020-10-21 MED ORDER — THIAMINE HCL 100 MG/ML IJ SOLN
100.0000 mg | Freq: Every day | INTRAMUSCULAR | Status: DC
  Filled 2020-10-21: qty 2

## 2020-10-21 NOTE — Progress Notes (Signed)
PROGRESS NOTE    Joel Campbell  OIN:[id number] DOB: September 14, 1970 DOA: 10/20/2020 PCP: System, Provider Not In     Brief Narrative:  50 year old BM PMHx ETOH abuse, gout, HTN, nicotine dependence, obesity OSA, obesity hypoventilation syndrome   Presents to [facility] emergency department from [facility] rehab facility with complaints of paresthesias.  Patient explains that he has had a longstanding history of extremely heavy alcohol use.  In the past several months, after the patient has extremely heavy drinking binges he begins to experience bouts of frequent watery diarrhea.  Shortly thereafter he begins to experience severe paresthesias.  The last time this happened prompted the patient to present to [facility] where he was hospitalized in observation status overnight and had multiple severe electrolyte abnormalities managed including hypocalcemia hypomagnesia and hypokalemia.  Upon discharge, the patient felt back to baseline but explains that this past weekend he had another weekend of extremely heavy drinking.  By Monday he began to develop severe watery diarrhea once again and by Tuesday he began to develop severe paresthesias.  She complains of associated generalized weakness as well.  At this point, the patient wanted to see striking alcohol together and presented to [facility] and was admitted on Wednesday for detox.  Unfortunately, patient's severe paresthesias persisted in the first 24 hours of his stay there prompting the patient to eventually present to [facility] emergency department for evaluation.  Upon further questioning patient denies fever, cough, shortness of breath, recent travel or confirmed contact with COVID-19 infection.  Upon evaluation in the emergency department here patient was found to have severe hypokalemia with potassium of 2.9, hypocalcemia with calcium of 5.9 and severe hypomagnesemia at 0.5.  Electrolyte  repletion was initiated but due to patient's persistent neurologic symptoms the hospitalist group was called to assess the patient for admission to the hospital.   Subjective: A/O x4, patient states drinks 1 pint of vodka per day and Boot Legger x10/day.   Assessment & Plan: Covid vaccination; vaccinated 3/3   Principal Problem:   Hypocalcemia Active Problems:   Hypokalemia   Hypomagnesemia   Alcohol dependence with uncomplicated withdrawal (HCC)   OSA (obstructive sleep apnea)   Essential hypertension   Nicotine dependence, cigarettes, uncomplicated   Wheezing   Prolonged QT interval   Obesity hypoventilation syndrome (HCC)   Obese  EtOH abuse -Patient very concerned that he will lose his bed at [facility] rehab facility -Presented with paresthesia and weakness secondary to electrolyte abnormalities, secondary to binge drinking of 1 pint of vodka per day and Boot Legger x10/day -States absolutely wants to stop drinking -CIWA protocol -Hopefully will be able to discharge patient on 3/19 would discharge on Librium that was initiated at [facility] rehab facility  Paresthesia/Weakness -Correct underlying electrolyte abnormalities -3/18 states appear to have resolved  Hypokalemia -Potassium goal> 4 -3/18 K-Dur 40 mEq x2   Hypomagnesmia -magnesium goal> 2 -3/18 magnesium IV 4 g  Hypocalcemia -3/18 calcium gluconate 3 g  Diarrhea -Resolved  Essential HTN -Amlodipine 5 mg daily -Clonidine 0.1 mg BID -Hydralazine PRN -Isosorbide dinitrate 20 mg TID -Toprol 200 mg daily  Prolonged QT interval -QT prolongation with QTC of 511 on admission -3/19 repeat EKG pending -May have to discontinue trazodone and Prozac  Nicotine dependence -Counseled on cessation -  OSA/Obesity Hypoventilation syndrome -Does not wish to initiate CPAP  Obese     DVT prophylaxis: Lovenox Code Status: Full Family Communication:  Status is: Inpatient    Dispo:  The  patient is from: Home              Anticipated d/c is to: [facility] rehab facility              Anticipated d/c date is: 3/19              Patient currently unstable      Consultants:    Procedures/Significant Events:    I have personally reviewed and interpreted all radiology studies and my findings are as above.  VENTILATOR SETTINGS:    Cultures   Antimicrobials:    Devices    LINES / TUBES:      Continuous Infusions:    Objective: Vitals:   10/21/20 0940 10/21/20 1015 10/21/20 1236 10/21/20 1755  BP: 129/76 132/76 120/72 (!) 153/84  Pulse: 83  76   Resp:   20   Temp:   98.1 F (36.7 C)   TempSrc:   Oral   SpO2: 95%  97%   Weight:      Height:        Intake/Output Summary (Last 24 hours) at 10/21/2020 1903 Last data filed at 10/21/2020 1900 Gross per 24 hour  Intake 2945.41 ml  Output 900 ml  Net 2045.41 ml   Filed Weights   10/20/20 1812 10/20/20 2145  Weight: 118.8 kg 122.5 kg    Examination:  General: A/O x4 No acute respiratory distress Eyes: negative scleral hemorrhage, negative anisocoria, negative icterus ENT: Negative Runny nose, negative gingival bleeding, Neck:  Negative scars, masses, torticollis, lymphadenopathy, JVD Lungs: Clear to auscultation bilaterally without wheezes or crackles Cardiovascular: Regular rate and rhythm without murmur gallop or rub normal S1 and S2 Abdomen: OBESE, negative abdominal pain, nondistended, positive soft, bowel sounds, no rebound, no ascites, no appreciable mass Extremities: No significant cyanosis, clubbing, or edema bilateral lower extremities Skin: Negative rashes, lesions, ulcers Psychiatric:  Negative depression, negative anxiety, negative fatigue, negative mania  Central nervous system:  Cranial nerves II through XII intact, tongue/uvula midline, all extremities muscle strength 5/5, sensation intact throughout,negative dysarthria, negative expressive aphasia, negative receptive  aphasia.  .     Data Reviewed: Care during the described time interval was provided by me .  I have reviewed this patient's available data, including medical history, events of note, physical examination, and all test results as part of my evaluation.  CBC: Recent Labs  Lab 10/20/20 1417 10/21/20 0412  WBC 8.0 7.3  NEUTROABS  --  4.9  HGB 12.4* 11.9*  HCT 36.8* 35.8*  MCV 107.3* 106.9*  PLT 248 226   Basic Metabolic Panel: Recent Labs  Lab 10/20/20 1417 10/20/20 1818 10/21/20 0412 10/21/20 0946  NA 139  --  139  --   K 2.9*  --  3.0*  --   CL 94*  --  96*  --   CO2 31  --  31  --   GLUCOSE 97  --  107*  --   BUN 15  --  13  --   CREATININE 1.08  --  0.87  --   CALCIUM 5.9*  --  6.3* 6.5*  MG  --  0.5* 1.2* 1.5*  PHOS  --   --  5.0*  --    GFR: Estimated Creatinine Clearance: 138.9 mL/min (by C-G formula based on SCr of 0.87 mg/dL). Liver Function Tests: Recent Labs  Lab 10/20/20 1818 10/21/20 0412  AST 22 20  ALT 13 12  ALKPHOS 90 86  BILITOT 0.6  0.8  PROT 6.6 6.6  ALBUMIN 3.0* 3.2*   Recent Labs  Lab 10/20/20 1818  LIPASE 36   No results for input(s): AMMONIA in the last 168 hours. Coagulation Profile: No results for input(s): INR, PROTIME in the last 168 hours. Cardiac Enzymes: No results for input(s): CKTOTAL, CKMB, CKMBINDEX, TROPONINI in the last 168 hours. BNP (last 3 results) No results for input(s): PROBNP in the last 8760 hours. HbA1C: No results for input(s): HGBA1C in the last 72 hours. CBG: No results for input(s): GLUCAP in the last 168 hours. Lipid Profile: No results for input(s): CHOL, HDL, LDLCALC, TRIG, CHOLHDL, LDLDIRECT in the last 72 hours. Thyroid Function Tests: No results for input(s): TSH, T4TOTAL, FREET4, T3FREE, THYROIDAB in the last 72 hours. Anemia Panel: No results for input(s): VITAMINB12, FOLATE, FERRITIN, TIBC, IRON, RETICCTPCT in the last 72 hours. Sepsis Labs: No results for input(s): PROCALCITON,  LATICACIDVEN in the last 168 hours.  Recent Results (from the past 240 hour(s))  SARS CORONAVIRUS 2 (TAT 6-24 HRS) Nasopharyngeal Nasopharyngeal Swab     Status: None   Collection Time: 10/20/20  7:39 PM   Specimen: Nasopharyngeal Swab  Result Value Ref Range Status   SARS Coronavirus 2 NEGATIVE NEGATIVE Final    Comment: (NOTE) SARS-CoV-2 target nucleic acids are NOT DETECTED.  The SARS-CoV-2 RNA is generally detectable in upper and lower respiratory specimens during the acute phase of infection. Negative results do not preclude SARS-CoV-2 infection, do not rule out co-infections with other pathogens, and should not be used as the sole basis for treatment or other patient management decisions. Negative results must be combined with clinical observations, patient history, and epidemiological information. The expected result is Negative.  Fact Sheet for Patients: HairSlick.nohttps://www.fda.gov/media/138098/download  Fact Sheet for Healthcare Providers: quierodirigir.comhttps://www.fda.gov/media/138095/download  This test is not yet approved or cleared by the [place] FDA and  has been authorized for detection and/or diagnosis of SARS-CoV-2 by FDA under an Emergency Use Authorization (EUA). This EUA will remain  in effect (meaning this test can be used) for the duration of the COVID-19 declaration under Se ction 564(b)(1) of the Act, 21 U.S.C. section 360bbb-3(b)(1), unless the authorization is terminated or revoked sooner.  Performed at [facility], [address] [place], [place] [address]          Radiology Studies: DG Chest Port 1 View  Result Date: 10/20/2020 CLINICAL DATA:  Arm and leg paresthesias. EXAM: PORTABLE CHEST 1 VIEW COMPARISON:  None. FINDINGS: The heart size and mediastinal contours are within normal limits. Both lungs are clear. The visualized skeletal structures are unremarkable. IMPRESSION: No active disease. Electronically Signed   By: Aram Candelahaddeus  Houston M.D.   On:  10/20/2020 19:57        Scheduled Meds: . amLODipine  5 mg Oral Daily  . chlordiazePOXIDE  10 mg Oral QID  . [START ON 10/22/2020] chlordiazePOXIDE  5 mg Oral QID  . cloNIDine  0.1 mg Oral BID  . enoxaparin (LOVENOX) injection  60 mg Subcutaneous Q24H  . FLUoxetine  20 mg Oral Daily  . folic acid  1 mg Oral Daily  . hydrALAZINE  25 mg Oral TID  . isosorbide dinitrate  20 mg Oral TID  . metoprolol  200 mg Oral Daily  . multivitamin with minerals  1 tablet Oral Daily  . pantoprazole  40 mg Oral Daily  . potassium chloride  40 mEq Oral BID  . thiamine  100 mg Oral Daily   Or  .  thiamine  100 mg Intravenous Daily  . traZODone  50 mg Oral QHS   Continuous Infusions:    LOS: 0 days    Time spent:40 min    WOODS, Roselind Messier, MD [facility]   If 7PM-7AM, please contact night-coverage 10/21/2020, 7:03 PM

## 2020-10-21 NOTE — H&P (Addendum)
History and Physical    Isidore Margraf ZOX:[id number] DOB: 1971/05/04 DOA: 10/20/2020  PCP: System, Provider Not In  Patient coming from: [facility]   Chief Complaint:  Chief Complaint  Patient presents with  . Tingling     HPI:    50 year old male with past medical history of alcohol abuse, gout, hypertension, nicotine dependence, obesity and obstructive sleep apnea who presents to [facility] emergency department from [facility] rehab facility with complaints of paresthesias.  Patient explains that he has had a longstanding history of extremely heavy alcohol use.  In the past several months, after the patient has extremely heavy drinking binges he begins to experience bouts of frequent watery diarrhea.  Shortly thereafter he begins to experience severe paresthesias.  The last time this happened prompted the patient to present to [facility] where he was hospitalized in observation status overnight and had multiple severe electrolyte abnormalities managed including hypocalcemia hypomagnesia and hypokalemia.  Upon discharge, the patient felt back to baseline but explains that this past weekend he had another weekend of extremely heavy drinking.  By Monday he began to develop severe watery diarrhea once again and by Tuesday he began to develop severe paresthesias.  She complains of associated generalized weakness as well.  At this point, the patient wanted to see striking alcohol together and presented to [facility] and was admitted on Wednesday for detox.  Unfortunately, patient's severe paresthesias persisted in the first 24 hours of his stay there prompting the patient to eventually present to [facility] emergency department for evaluation.  Upon further questioning patient denies fever, cough, shortness of breath, recent travel or confirmed contact with COVID-19 infection.  Upon evaluation in the emergency department here patient was found to  have severe hypokalemia with potassium of 2.9, hypocalcemia with calcium of 5.9 and severe hypomagnesemia at 0.5.  Electrolyte repletion was initiated but due to patient's persistent neurologic symptoms the hospitalist group was called to assess the patient for admission to the hospital.  Review of Systems:   Review of Systems  Neurological: Positive for tingling, tremors and weakness.  Psychiatric/Behavioral: Positive for substance abuse.    Past Medical History:  Diagnosis Date  . Hypertension     History reviewed. No pertinent surgical history.   reports that he has been smoking cigarettes. He started smoking about 20 years ago. He has never used smokeless tobacco. He reports current alcohol use. No history on file for drug use.  Allergies  Allergen Reactions  . Lisinopril Swelling    Family History  Problem Relation Age of Onset  . Heart attack Neg Hx      Prior to Admission medications   Medication Sig Start Date End Date Taking? Authorizing Provider  allopurinol (ZYLOPRIM) 300 MG tablet Take 300 mg by mouth daily. 08/12/20  Yes [provider]  amLODipine (NORVASC) 5 MG tablet Take 5 mg by mouth daily. 08/10/20  Yes [provider]  Calcium Carb-Cholecalciferol (OYSTER SHELL CALCIUM W/D) 500-200 MG-UNIT TABS Take 2 tablets by mouth 2 (two) times daily. 09/21/20  Yes [provider]  chlordiazePOXIDE (LIBRIUM) 25 MG capsule Take 25 mg by mouth 3 (three) times daily as needed for anxiety.   Yes [provider]  cloNIDine (CATAPRES) 0.1 MG tablet Take 0.1 mg by mouth 2 (two) times daily. 09/05/20  Yes [provider]  clotrimazole-betamethasone (LOTRISONE) cream APPLY TO AFFECTED AREA TWICE A DAY 09/08/20  Yes [provider]  FLUoxetine (PROZAC) 20 MG capsule  Take 20 mg by mouth daily. 09/09/20  Yes [provider]  hydrALAZINE (APRESOLINE) 25 MG tablet Take 25 mg by mouth 3 (three) times daily. 07/01/20  Yes [provider]  isosorbide dinitrate (ISORDIL) 20 MG tablet Take 20 mg by mouth 3 (three) times daily. 07/04/20  Yes [provider]  KLOR-CON M20 20 MEQ tablet Take 40 mEq by mouth daily. 08/10/20  Yes [provider]  magnesium oxide (MAG-OX) 400 MG tablet Take 2 tablets by mouth daily. 09/21/20  Yes [provider]  metoprolol (TOPROL-XL) 200 MG 24 hr tablet Take 200 mg by mouth daily. 09/09/20  Yes [provider]  Multiple Vitamins-Minerals (MULTIVITAMIN ADULTS PO) Take 1 capsule by mouth daily.   Yes [provider]  omeprazole (PRILOSEC) 20 MG capsule Take 20 mg by mouth daily. 08/09/20  Yes [provider]  thiamine (VITAMIN B-1) 100 MG tablet Take 100 mg by mouth daily.   Yes [provider]  traZODone (DESYREL) 50 MG tablet Take 50 mg by mouth at bedtime.   Yes [provider]    Physical Exam: Vitals:   10/20/20 2145 10/20/20 2149 10/21/20 0158 10/21/20 0335  BP:  123/74 126/79   Pulse:  86 81   Resp:  20 20   Temp:  98.5 F (36.9 C) 98.6 F (37 C)   TempSrc:  Oral Oral   SpO2: 94% 92% 90% (!) 88%  Weight: 122.5 kg     Height: 6' (1.829 m)       Constitutional: Acute alert and oriented x3, no associated distress.   Skin: no rashes, no lesions, good skin turgor noted. Eyes: Pupils are equally reactive to light.  No evidence of scleral icterus or conjunctival pallor.  ENMT: Moist mucous membranes noted.  Posterior pharynx clear of any exudate or lesions.  Neck: normal, supple, no masses, no thyromegaly.  No evidence of jugular venous distension.   Respiratory: Notable intermittent expiratory wheezing heard in all fields.  No evidence of associated rales. Normal respiratory effort. No accessory muscle use.  Cardiovascular: Regular rate and rhythm, no murmurs / rubs / gallops. No extremity edema. 2+ pedal pulses. No carotid bruits.  Chest:   Nontender without crepitus or deformity.   Back:   Nontender without  crepitus or deformity. Abdomen: Abdomen is protuberant but soft and nontender.  No evidence of intra-abdominal masses.  Positive bowel sounds noted in all quadrants.   Musculoskeletal: No joint deformity upper and lower extremities. Good ROM, no contractures. Normal muscle tone.  Neurologic: Patient somewhat tremulous throughout the interview with episodes of clonus on neuro exam.  CN 2-12 grossly intact. Sensation intact.  Patient moving all 4 extremities spontaneously.  Patient is following all commands.  Patient is responsive to verbal stimuli.   Psychiatric: Patient exhibits normal mood with appropriate affect.  Patient seems to possess insight as to their current situation.     Labs on Admission: I have personally reviewed following labs and imaging studies -   CBC: Recent Labs  Lab 10/20/20 1417  WBC 8.0  HGB 12.4*  HCT 36.8*  MCV 107.3*  PLT 248   Basic Metabolic Panel: Recent Labs  Lab 10/20/20 1417 10/20/20 1818  NA 139  --   K 2.9*  --   CL 94*  --   CO2 31  --   GLUCOSE 97  --   BUN 15  --   CREATININE 1.08  --   CALCIUM 5.9*  --  MG  --  0.5*   GFR: Estimated Creatinine Clearance: 111.9 mL/min (by C-G formula based on SCr of 1.08 mg/dL). Liver Function Tests: Recent Labs  Lab 10/20/20 1818  AST 22  ALT 13  ALKPHOS 90  BILITOT 0.6  PROT 6.6  ALBUMIN 3.0*   Recent Labs  Lab 10/20/20 1818  LIPASE 36   No results for input(s): AMMONIA in the last 168 hours. Coagulation Profile: No results for input(s): INR, PROTIME in the last 168 hours. Cardiac Enzymes: No results for input(s): CKTOTAL, CKMB, CKMBINDEX, TROPONINI in the last 168 hours. BNP (last 3 results) No results for input(s): PROBNP in the last 8760 hours. HbA1C: No results for input(s): HGBA1C in the last 72 hours. CBG: No results for input(s): GLUCAP in the last 168 hours. Lipid Profile: No results for input(s): CHOL, HDL, LDLCALC, TRIG, CHOLHDL, LDLDIRECT in the last 72 hours. Thyroid  Function Tests: No results for input(s): TSH, T4TOTAL, FREET4, T3FREE, THYROIDAB in the last 72 hours. Anemia Panel: No results for input(s): VITAMINB12, FOLATE, FERRITIN, TIBC, IRON, RETICCTPCT in the last 72 hours. Urine analysis: No results found for: COLORURINE, APPEARANCEUR, LABSPEC, PHURINE, GLUCOSEU, HGBUR, BILIRUBINUR, KETONESUR, PROTEINUR, UROBILINOGEN, NITRITE, LEUKOCYTESUR  Radiological Exams on Admission - Personally Reviewed: DG Chest Port 1 View  Result Date: 10/20/2020 CLINICAL DATA:  Arm and leg paresthesias. EXAM: PORTABLE CHEST 1 VIEW COMPARISON:  None. FINDINGS: The heart size and mediastinal contours are within normal limits. Both lungs are clear. The visualized skeletal structures are unremarkable. IMPRESSION: No active disease. Electronically Signed   By: Aram Candela M.D.   On: 10/20/2020 19:57    EKG: Personally reviewed.  Rhythm is normal sinus rhythm with heart rate of 83 bpm.  Notable prolonged QTC of 511 no dynamic ST segment changes appreciated.  Assessment/Plan Principal Problem: Paresthesias and weakness   Patient presenting with several day history of recurrent paresthesias and weakness  Patient has just recently been hospitalized, evaluated and managed for this for this at [facility] 1 month ago.  Episodes of severe paresthesias seem to occur after binge drinking, resulting in several days of watery diarrhea followed by the sequela of his electrolyte disturbances.  Severe generalized weakness and is likely secondary to severe hypokalemia and hypomagnesemia while paresthesias are likely secondary to substantial hypocalcemia.  The list of possible etiologies of hypocalcemia is quite extensive however considering the temporal nature of the patient's symptoms it is likely that his severe hypomagnesemia is exacerbating hypocalcemia  Replacing all electrolytes with intravenous magnesium sulfate, oral and intravenous potassium chloride as well  as intravenous calcium gluconate  Monitoring electrolytes with serial chemistries  Monitoring patient on telemetry  Hypocalcemia  As mentioned above, etiology is felt to be related to other severe electrolyte abnormalities due to binge drinking  Obtaining PTH and vitamin D level  Remainder of assessment and plan as above  Active Problems:   Hypokalemia   Felt to be related to heavy alcohol use and bouts of diarrhea  Replacing with oral and intravenous potassium chloride  Performing serial chemistries to monitor potassium levels    Hypomagnesemia  Felt to be related to heavy alcohol use and bouts of diarrhea  Intravenous magnesium sulfate  Monitoring levels with serial chemistries    Alcohol dependence with uncomplicated withdrawal (HCC)   Continuing Librium taper that was initiated at [facility]  CIWA protocol additionally initiated with additional doses of as needed benzodiazepines to be given as needed for bouts of withdrawal  Essential hypertension  .  Resume patients home regimen or oral antihypertensives . Titrate antihypertensive regimen as necessary to achieve adequate BP control . PRN intravenous antihypertensives for excessively elevated blood pressure     Nicotine dependence, cigarettes, uncomplicated   Counseling patient on cessation daily    Wheezing   Notable substantial wheezing on exam  No documented history of COPD although this is likely present considering patient's longstanding history of smoking  As needed bronchodilator therapy for bouts of wheezing    Prolonged QT interval  Mild QT prolongation with QTC of 511 on admission EKG  Monitoring patient on telemetry  Consider discontinuation of trazodone and Prozac    OSA (obstructive sleep apnea)    Recent diagnosis of OSA  Patient does not wish to initiate CPAP nightly while here   Code Status:  Full code Family Communication: deferred   Status is: Observation  The  patient remains OBS appropriate and will d/c before 2 midnights.  Dispo: The patient is from: Home              Anticipated d/c is to: Home              Patient currently is not medically stable to d/c.   Difficult to place patient No        Marinda ElkGeorge J Seth Friedlander MD [facility] Pager [phone]- [phone]  If 7PM-7AM, please contact night-coverage [email or web address] Use universal [place] password for that web site. If you do not have the password, please call the hospital operator.  10/21/2020, 3:43 AM

## 2020-10-22 DIAGNOSIS — R531 Weakness: Secondary | ICD-10-CM | POA: Diagnosis not present

## 2020-10-22 DIAGNOSIS — Z20822 Contact with and (suspected) exposure to covid-19: Secondary | ICD-10-CM | POA: Diagnosis not present

## 2020-10-22 DIAGNOSIS — E876 Hypokalemia: Secondary | ICD-10-CM | POA: Diagnosis not present

## 2020-10-22 DIAGNOSIS — R202 Paresthesia of skin: Secondary | ICD-10-CM | POA: Diagnosis not present

## 2020-10-22 DIAGNOSIS — F10230 Alcohol dependence with withdrawal, uncomplicated: Secondary | ICD-10-CM | POA: Diagnosis not present

## 2020-10-22 DIAGNOSIS — R197 Diarrhea, unspecified: Secondary | ICD-10-CM | POA: Diagnosis not present

## 2020-10-22 LAB — CBC WITH DIFFERENTIAL/PLATELET
Abs Immature Granulocytes: 0.03 10*3/uL (ref 0.00–0.07)
Basophils Absolute: 0 10*3/uL (ref 0.0–0.1)
Basophils Relative: 0 %
Eosinophils Absolute: 0.3 10*3/uL (ref 0.0–0.5)
Eosinophils Relative: 4 %
HCT: 35.5 % — ABNORMAL LOW (ref 39.0–52.0)
Hemoglobin: 11.5 g/dL — ABNORMAL LOW (ref 13.0–17.0)
Immature Granulocytes: 0 %
Lymphocytes Relative: 13 %
Lymphs Abs: 1 10*3/uL (ref 0.7–4.0)
MCH: 35.6 pg — ABNORMAL HIGH (ref 26.0–34.0)
MCHC: 32.4 g/dL (ref 30.0–36.0)
MCV: 109.9 fL — ABNORMAL HIGH (ref 80.0–100.0)
Monocytes Absolute: 0.3 10*3/uL (ref 0.1–1.0)
Monocytes Relative: 4 %
Neutro Abs: 6.5 10*3/uL (ref 1.7–7.7)
Neutrophils Relative %: 79 %
Platelets: 230 10*3/uL (ref 150–400)
RBC: 3.23 MIL/uL — ABNORMAL LOW (ref 4.22–5.81)
RDW: 15.4 % (ref 11.5–15.5)
WBC: 8.2 10*3/uL (ref 4.0–10.5)
nRBC: 0 % (ref 0.0–0.2)

## 2020-10-22 LAB — COMPREHENSIVE METABOLIC PANEL
ALT: 14 U/L (ref 0–44)
AST: 21 U/L (ref 15–41)
Albumin: 3 g/dL — ABNORMAL LOW (ref 3.5–5.0)
Alkaline Phosphatase: 92 U/L (ref 38–126)
Anion gap: 8 (ref 5–15)
BUN: 5 mg/dL — ABNORMAL LOW (ref 6–20)
CO2: 33 mmol/L — ABNORMAL HIGH (ref 22–32)
Calcium: 7.8 mg/dL — ABNORMAL LOW (ref 8.9–10.3)
Chloride: 99 mmol/L (ref 98–111)
Creatinine, Ser: 0.7 mg/dL (ref 0.61–1.24)
GFR, Estimated: >60 mL/min (ref >60)
Glucose, Bld: 150 mg/dL — ABNORMAL HIGH (ref 70–99)
Potassium: 3.9 mmol/L (ref 3.5–5.1)
Sodium: 140 mmol/L (ref 135–145)
Total Bilirubin: 0.6 mg/dL (ref 0.3–1.2)
Total Protein: 6.8 g/dL (ref 6.5–8.1)

## 2020-10-22 LAB — PTH, INTACT AND CALCIUM
Calcium, Total (PTH): 6.1 mg/dL (ref 8.7–10.2)
PTH: 18 pg/mL (ref 15–65)

## 2020-10-22 LAB — PHOSPHORUS: Phosphorus: 2.5 mg/dL (ref 2.5–4.6)

## 2020-10-22 LAB — MAGNESIUM: Magnesium: 1.5 mg/dL — ABNORMAL LOW (ref 1.7–2.4)

## 2020-10-22 MED ORDER — MAGNESIUM SULFATE 4 GM/100ML IV SOLN
4.0000 g | Freq: Once | INTRAVENOUS | Status: AC
  Administered 2020-10-22: 4 g via INTRAVENOUS
  Filled 2020-10-22: qty 100

## 2020-10-22 MED ORDER — SODIUM CHLORIDE 0.9 % IV SOLN
3.0000 g | Freq: Once | INTRAVENOUS | Status: AC
  Administered 2020-10-22: 3 g via INTRAVENOUS
  Filled 2020-10-22: qty 30

## 2020-10-22 MED ORDER — FOLIC ACID 1 MG PO TABS: 1.0000 mg | ORAL_TABLET | Freq: Every day | ORAL | 0 refills | Status: AC

## 2020-10-22 NOTE — TOC Progression Note (Signed)
Transition of Care [facility]) - Progression Note    Patient Details  Name: Joel Campbell MRN: [id number] Date of Birth: 11-19-1970  Transition of Care [facility] CM/SW Contact  Darleene Cleaver, [place] Phone Number: 10/22/2020, 1:13 PM  Clinical Narrative:     CSW received consult that patient needs substance abuse resources.  CSW was able to include names and phone numbers for facilities for substance abuse resources on patient's AVS.       Expected Discharge Plan and Services                                                 Social Determinants of Health (SDOH) Interventions    Readmission Risk Interventions No flowsheet data found.

## 2020-10-22 NOTE — Discharge Summary (Signed)
Physician Discharge Summary  Joel Campbell JYN:[id number] DOB: 06-Jan-1971 DOA: 10/20/2020  PCP: System, Provider Not In  Admit date: 10/20/2020 Discharge date: 10/22/2020  Time spent: 36 minutes  Recommendations for Outpatient Follow-up:   Covid vaccination; vaccinated 3/3    EtOH abuse -Patient very concerned that he will lose his bed at [facility] facility -Presented with paresthesia and weakness secondary to electrolyte abnormalities, secondary to binge drinking of 1 pint of vodka per day and [employer] x10/day -States absolutely wants to stop drinking -3/19 patient stable for discharge to [facility]  Paresthesia/Weakness -Correct underlying electrolyte abnormalities -3/18 states appear to have resolved  Hypokalemia -Potassium goal> 4 -Resolved   Hypomagnesmia -magnesium goal> 2 -3/19 Magnesium IV 4 g Ensure administered to patient prior to discharge  Hypocalcemia -3/17 PTH total= 6.1 (low).  Most likely secondary to patient's severe Hypomagnesmia secondary to his severe EtOH abuse -3/19 calcium gluconate 3 g -3/19 corrected calcium 8.6.  Diarrhea -Resolved  Essential HTN -Amlodipine 5 mg daily -Clonidine 0.1 mg BID -Isosorbide dinitrate 20 mg TID -Toprol 200 mg daily  Prolonged QT interval -QT prolongation with QTC of 511 on admission -3/19 repeat EKG was not completed -3/19 discontinue trazodone  -3/19 [facility] facility complete EKG in the near future and if patient still has prolonged QT interval consider discontinuing Prozac  Nicotine dependence -Counseled on cessation  OSA/Obesity Hypoventilation syndrome -Does not wish to initiate CPAP  Obese    Discharge Diagnoses:  Principal Problem:   Hypocalcemia Active Problems:   Hypokalemia   Hypomagnesemia   Alcohol dependence with uncomplicated withdrawal (HCC)   OSA (obstructive sleep apnea)   Essential hypertension   Nicotine dependence,  cigarettes, uncomplicated   Wheezing   Prolonged QT interval   Obesity hypoventilation syndrome (HCC)   Obese   Discharge Condition: Stable  Diet recommendation: Heart healthy  Filed Weights   10/20/20 1812 10/20/20 2145  Weight: 118.8 kg 122.5 kg    History of present illness:  50 year old BM PMHx ETOH abuse, gout, HTN, nicotine dependence, obesity OSA, obesity hypoventilation syndrome   Presents to [facility] emergency department from [facility] rehab facility with complaints of paresthesias.  Patient explains that he has had a longstanding history of extremely heavy alcohol use. In the past several months, after the patient has extremely heavy drinking binges he begins to experience bouts of frequent watery diarrhea. Shortly thereafter he begins to experience severe paresthesias.  The last time this happened prompted the patient to present to [facility] where he was hospitalized in observation status overnight and had multiple severe electrolyte abnormalities managed including hypocalcemia hypomagnesia and hypokalemia.  Upon discharge, the patient felt back to baseline but explains that this past weekend he had another weekend of extremely heavy drinking. By Monday he began to develop severe watery diarrhea once again and by Tuesday he began to develop severe paresthesias. She complains of associated generalized weakness as well.  At this point, the patient wanted to see striking alcohol together and presented to [facility] and was admitted on Wednesday for detox. Unfortunately, patient's severe paresthesias persisted in the first 24 hours of his stay there prompting the patient to eventually present to [facility] emergency department for evaluation.  Upon further questioning patient denies fever, cough, shortness of breath, recent travel or confirmed contact with COVID-19 infection.  Upon evaluation in the emergency department  here patient was found to have severe hypokalemia with potassium of 2.9, hypocalcemia with  calcium of 5.9 and severe hypomagnesemia at 0.5. Electrolyte repletion was initiated but due to patient's persistent neurologic symptoms the hospitalist group was called to assess the patient for admission to the hospital.  Hospital Course:  See above   Cultures   3/17 SARS coronavirus negative    Discharge Exam: Vitals:   10/21/20 2139 10/22/20 0441 10/22/20 1037 10/22/20 1405  BP: (!) 161/77 (!) 160/79 134/71 116/68  Pulse: 88 (!) 105 90 73  Resp: (!) 22 20 16  (!) 24  Temp:  98.4 F (36.9 C) 98.3 F (36.8 C) 98.4 F (36.9 C)  TempSrc:  Oral Oral Oral  SpO2: 99% 92% 91% 91%  Weight:      Height:        General: A/O x4 No acute respiratory distress Eyes: negative scleral hemorrhage, negative anisocoria, negative icterus ENT: Negative Runny nose, negative gingival bleeding, Neck:  Negative scars, masses, torticollis, lymphadenopathy, JVD Lungs: Clear to auscultation bilaterally without wheezes or crackles Cardiovascular: Regular rate and rhythm without murmur gallop or rub normal S1 and S2 Abdomen: OBESE, negative abdominal pain, nondistended, positive soft, bowel sounds, no rebound, no ascites, no appreciable mass   Discharge Instructions   Allergies as of 10/22/2020      Reactions   Lisinopril Swelling      Medication List    STOP taking these medications   traZODone 50 MG tablet Commonly known as: DESYREL     TAKE these medications   allopurinol 300 MG tablet Commonly known as: ZYLOPRIM Take 300 mg by mouth daily.   amLODipine 5 MG tablet Commonly known as: NORVASC Take 5 mg by mouth daily.   chlordiazePOXIDE 25 MG capsule Commonly known as: LIBRIUM Take 25 mg by mouth 3 (three) times daily as needed for anxiety.   cloNIDine 0.1 MG tablet Commonly known as: CATAPRES Take 0.1 mg by mouth 2 (two) times daily.   clotrimazole-betamethasone cream Commonly known  as: LOTRISONE APPLY TO AFFECTED AREA TWICE A DAY   FLUoxetine 20 MG capsule Commonly known as: PROZAC Take 20 mg by mouth daily.   folic acid 1 MG tablet Commonly known as: FOLVITE Take 1 tablet (1 mg total) by mouth daily. Start taking on: October 23, 2020   hydrALAZINE 25 MG tablet Commonly known as: APRESOLINE Take 25 mg by mouth 3 (three) times daily.   isosorbide dinitrate 20 MG tablet Commonly known as: ISORDIL Take 20 mg by mouth 3 (three) times daily.   Klor-Con M20 20 MEQ tablet Generic drug: potassium chloride SA Take 40 mEq by mouth daily.   magnesium oxide 400 MG tablet Commonly known as: MAG-OX Take 2 tablets by mouth daily.   metoprolol 200 MG 24 hr tablet Commonly known as: TOPROL-XL Take 200 mg by mouth daily.   MULTIVITAMIN ADULTS PO Take 1 capsule by mouth daily.   omeprazole 20 MG capsule Commonly known as: PRILOSEC Take 20 mg by mouth daily.   Oyster Shell Calcium w/D 500-200 MG-UNIT Tabs Take 2 tablets by mouth 2 (two) times daily.   thiamine 100 MG tablet Commonly known as: Vitamin B-1 Take 100 mg by mouth daily.      Allergies  Allergen Reactions  . Lisinopril Swelling      The results of significant diagnostics from this hospitalization (including imaging, microbiology, ancillary and laboratory) are listed below for reference.    Significant Diagnostic Studies: DG Chest Port 1 View  Result Date: 10/20/2020 CLINICAL DATA:  Arm and leg paresthesias. EXAM: PORTABLE CHEST 1 VIEW  COMPARISON:  None. FINDINGS: The heart size and mediastinal contours are within normal limits. Both lungs are clear. The visualized skeletal structures are unremarkable. IMPRESSION: No active disease. Electronically Signed   By: Aram Candela M.D.   On: 10/20/2020 19:57    Microbiology: Recent Results (from the past 240 hour(s))  SARS CORONAVIRUS 2 (TAT 6-24 HRS) Nasopharyngeal Nasopharyngeal Swab     Status: None   Collection Time: 10/20/20  7:39 PM    Specimen: Nasopharyngeal Swab  Result Value Ref Range Status   SARS Coronavirus 2 NEGATIVE NEGATIVE Final    Comment: (NOTE) SARS-CoV-2 target nucleic acids are NOT DETECTED.  The SARS-CoV-2 RNA is generally detectable in upper and lower respiratory specimens during the acute phase of infection. Negative results do not preclude SARS-CoV-2 infection, do not rule out co-infections with other pathogens, and should not be used as the sole basis for treatment or other patient management decisions. Negative results must be combined with clinical observations, patient history, and epidemiological information. The expected result is Negative.  Fact Sheet for Patients: HairSlick.no  Fact Sheet for Healthcare Providers: quierodirigir.com  This test is not yet approved or cleared by the [place] FDA and  has been authorized for detection and/or diagnosis of SARS-CoV-2 by FDA under an Emergency Use Authorization (EUA). This EUA will remain  in effect (meaning this test can be used) for the duration of the COVID-19 declaration under Se ction 564(b)(1) of the Act, 21 U.S.C. section 360bbb-3(b)(1), unless the authorization is terminated or revoked sooner.  Performed at [facility], [address], [place], [place] [address]      Labs: Basic Metabolic Panel: Recent Labs  Lab 10/20/20 1417 10/20/20 1818 10/20/20 2239 10/21/20 0412 10/21/20 0946 10/22/20 0931  NA 139  --   --  139  --  140  K 2.9*  --   --  3.0*  --  3.9  CL 94*  --   --  96*  --  99  CO2 31  --   --  31  --  33*  GLUCOSE 97  --   --  107*  --  150*  BUN 15  --   --  13  --  5*  CREATININE 1.08  --   --  0.87  --  0.70  CALCIUM 5.9*  --  6.1 6.3* 6.5* 7.8*  MG  --  0.5*  --  1.2* 1.5* 1.5*  PHOS  --   --   --  5.0*  --  2.5   Liver Function Tests: Recent Labs  Lab 10/20/20 1818 10/21/20 0412 10/22/20 0931  AST 22 20 21   ALT 13 12 14   ALKPHOS  90 86 92  BILITOT 0.6 0.8 0.6  PROT 6.6 6.6 6.8  ALBUMIN 3.0* 3.2* 3.0*   Recent Labs  Lab 10/20/20 1818  LIPASE 36   No results for input(s): AMMONIA in the last 168 hours. CBC: Recent Labs  Lab 10/20/20 1417 10/21/20 0412 10/22/20 0931  WBC 8.0 7.3 8.2  NEUTROABS  --  4.9 6.5  HGB 12.4* 11.9* 11.5*  HCT 36.8* 35.8* 35.5*  MCV 107.3* 106.9* 109.9*  PLT 248 226 230   Cardiac Enzymes: No results for input(s): CKTOTAL, CKMB, CKMBINDEX, TROPONINI in the last 168 hours. BNP: BNP (last 3 results) Recent Labs    10/20/20 2239  BNP 41.1    ProBNP (last 3 results) No results for input(s): PROBNP in the last 8760 hours.  CBG:  No results for input(s): GLUCAP in the last 168 hours.     Signed:  Dia Crawford, MD [facility]

## 2020-10-22 NOTE — TOC Progression Note (Addendum)
Transition of Care [facility]) - Progression Note    Patient Details  Name: Joel Campbell MRN: [id number] Date of Birth: August 28, 1970  Transition of Care [facility]) CM/SW Contact  Darleene Cleaver, [place] Phone Number: 10/22/2020, 3:01 PM  Clinical Narrative:    CSW was informed that patient is from [facility] and needs to return.  CSW contacted [facility] and spoke to [place], she requested that clinicals be faxed to her so she can review patient's information and let CSW know if he can return today.  4:45pm  CSW received phone call back from [place] at [facility], she said her doctor was concerned about patient EKG reading.  Per Elnita Maxwell their physician Dr. Elray Buba wanted to have cardiology see him.  CSW had attending physician speak to her.  5:30pm  CSW received phone call back from [place] at [facility], she said their physician agreed to take him back.  Per Elnita Maxwell they will send the driver, CSW provided Elnita Maxwell with the nurse's station number and the charge nurse number to have the driver call once he arrives.  Expected Discharge Plan and Services  Back to [facility].    Expected Discharge Date: 10/22/20                 Social Determinants of Health (SDOH) Interventions    Readmission Risk Interventions No flowsheet data found.

## 2020-10-22 NOTE — Progress Notes (Signed)
Critical lab value: calcium 6.1 Notified Dr. Joseph Art

## 2020-10-22 NOTE — Plan of Care (Signed)

## 2020-10-22 NOTE — TOC Transition Note (Addendum)
Transition of Care [facility]) - CM/SW Discharge Note   Patient Details  Name: Hannan Hutmacher MRN: [id number] Date of Birth: [birth year]  Transition of Care [facility]) CM/SW Contact:  Darleene Cleaver, LCSW Phone Number: 10/22/2020, 5:40 PM   Clinical Narrative:     Patient to be d/c'ed today to [facility].  Patient and family agreeable to plans will transport via [employer].  CSW updated bedside nurse and charge nurse.  CSW signing off.     Final next level of care: Other (comment) ([facility].) Barriers to Discharge: Barriers Resolved   Patient Goals and CMS Choice Patient states their goals for this hospitalization and ongoing recovery are:: To return to treatment center. CMS Medicare.gov Compare Post Acute Care list provided to:: Patient Choice offered to / list presented to : Patient  Discharge Placement                       Discharge Plan and Services                                     Social Determinants of Health (SDOH) Interventions     Readmission Risk Interventions No flowsheet data found.

## 2020-10-22 NOTE — Discharge Instructions (Signed)
SUBSTANCE ABUSE RESOURCES  Outpatient Substance Use Treatment Services   [facility] [address] [place], [place] [address]  [phone] Private insurance, Medicare A&B, and GCCN   [facility]  [address]  [place], [place] [address] [phone] Medicaid, Self Pay   [facility]      [address]  [place], [place] [phone] Medicaid and Private Insurance, Self Pay   [facility] [address]  [place], [place]  [phone] Private Insurance, and Self Pay  [facility]      [address]    [place], [place] [address]  [phone] or [phone] Private Insurance Only                 [facility] [address]  [place], [place] [address] [phone] Medicaid, Medicare, Private Insurance  [facility] [address]  [place], [place] [address] [phone] Services are free or reduced  [facility]  [address] [phone]  Self Pay only, sliding scale  [facility]  [address]  [place], [place] [address] [phone] ([facility]) Self Pay, Medicaid Only   [facility] [address]  [place], [place] [address] [phone] Medicaid, Medicare, Private Insurance  Residential Substance Use Treatment Services   [facility]  [address]  [place], [place] [address]  [phone] or [phone] Detox (Medicare, Medicaid, private insurance, and self pay)  Residential Rehab 14 days (Medicare, Medicaid, private insurance, and self pay)   [facility]  [address] [place], [place]  [phone]  Male and Male Detox (Self Pay and Medicaid limited availability)  Rehab only Male (Medicaid and self pay  only)   [facility]      [address]  [place], [place] [address]  [phone] or [phone] Detox and Residential Treatment Private Insurance Only   [facility]  [address]  [place], [place] [address]  [phone]  Treatment Only, must make assessment appointment, and must be sober for assessment appointment.  Self Pay Only, Medicare A&B, [place] Medicaid, [place] ID only! *Transportation assistance offered from [employer] [address]  [facility]     [address] [place], [place] [address] Walk in interviews M-Sat 8-4p No pending legal charges [phone]  [facility]:  [facility]  [address] [place], [place] [phone] (Self Pay, Medicaid)  [facility] [address] [place], [place] [address] [phone] Detox and Residential Treatment Medicare and Private Insurance  [facility] [address]  [place], [place] [address] 28 Day Women's Facility: [phone] 28 Day Men's Facility: [phone] Long-term Residential Program:  [phone] Males 25 and Over (No Insurance, upfront fee)  [facility]  [address] [place], [place] [address] [phone] Private Insurance with [employer], Private Pay  [facility] [address] [place], [place] [address] Local [phone] Private Insurance Only  [facility] [address]  [place], [place] [address]  [phone] (Males, upfront fee)  [facility] [place] [address]  [place], [address] [phone] Private Insurance   [facility] Locations  [facility]  [address]  [place], [place]  [phone] Christian Based Program for individuals experiencing homelessness Self Pay, No insurance  [facility]  Men's program: [facility] [address]   [facility] [facility] [employer] program: [employer] [address] [place], [place]  [phone] Christian Based Program for individuals experiencing homelessness Self Pay, No insurance  [facility] [address] [place], [place] [address]  [phone] [facility] Based Program for individuals experiencing homelessness Self Pay, No insurance  [facility]'s Division [address] [place], [place] [address] [phone] [facility] Program for individuals experiencing homelessness Self Pay, No insurance  [facility] [address] [place], [place] [phone] Christian Based Program for males experiencing homelessness Self Pay, No insurance

## 2022-02-19 IMAGING — DX DG CHEST 1V PORT
1 series · 1 of 1 positions shown · non-contrast
Comparison: None.

CLINICAL DATA: Arm and leg paresthesias.

EXAM:
PORTABLE CHEST 1 VIEW

[chest ap]
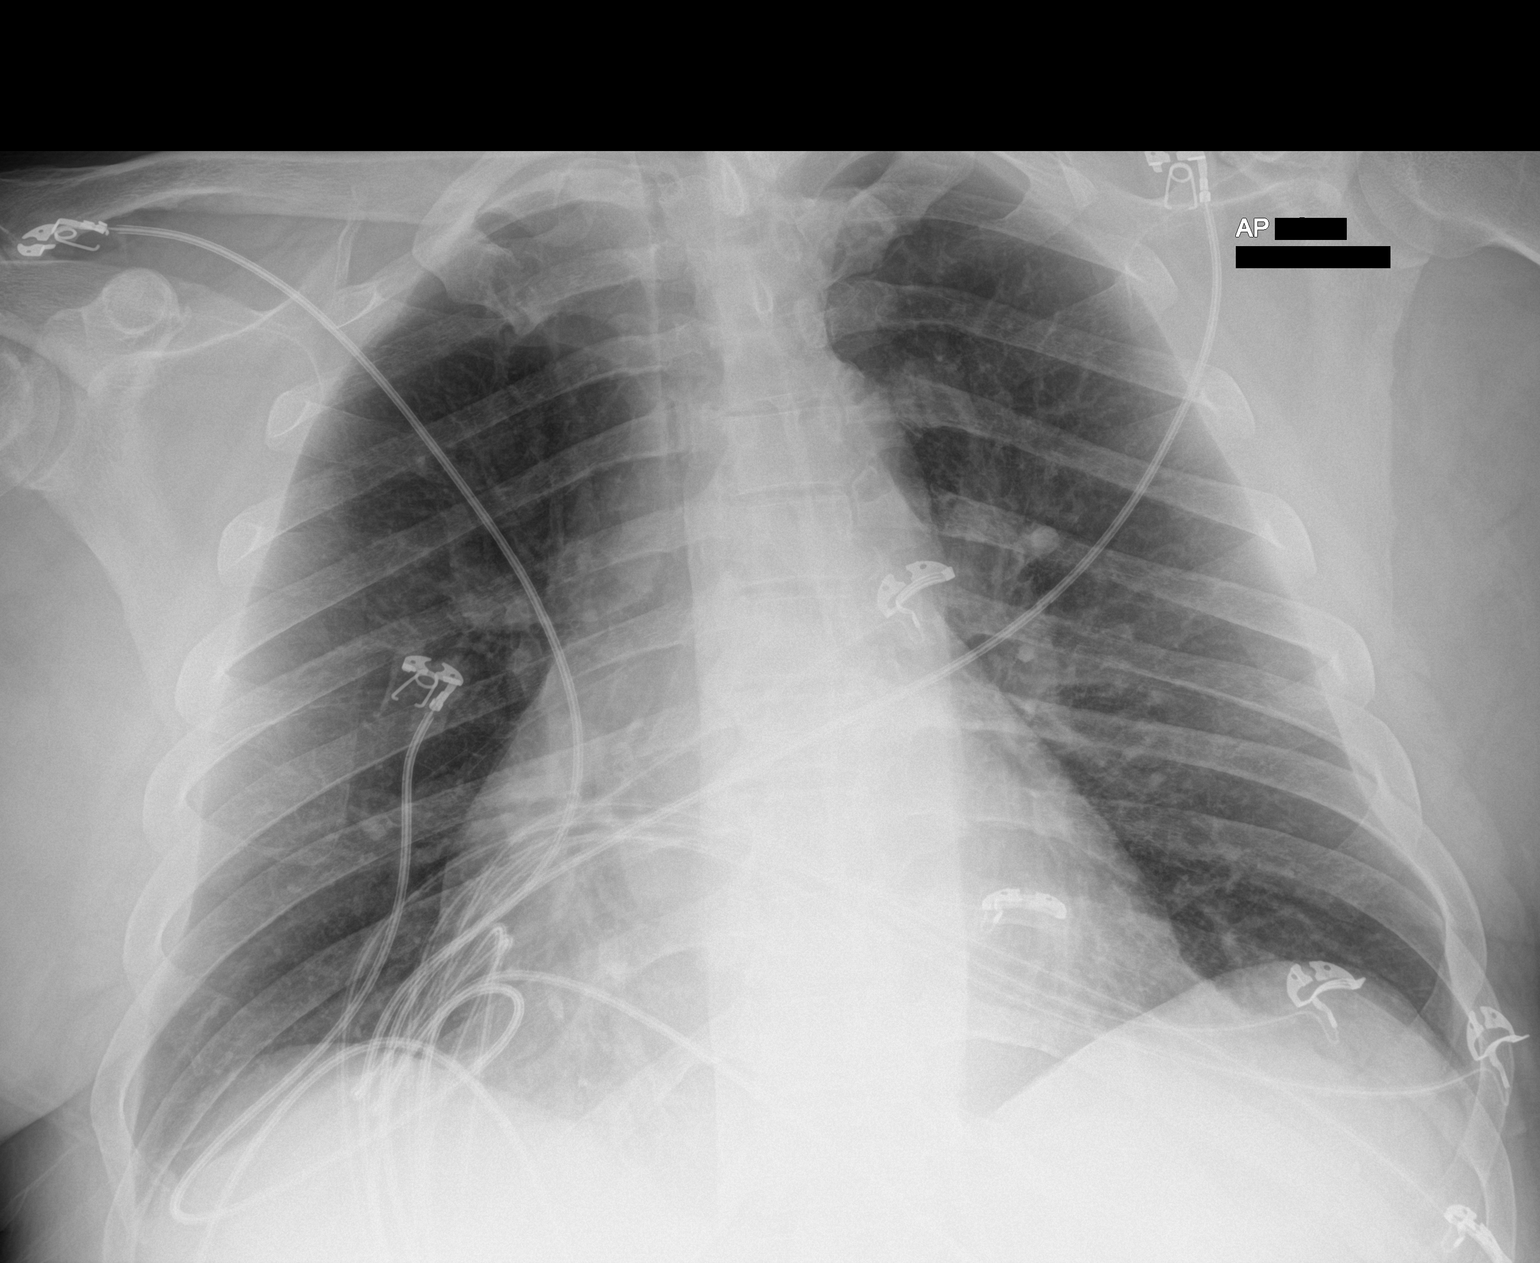

[1 of 1 positions shown; findings below may reference images not displayed]

FINDINGS: The heart size and mediastinal contours are within normal limits.
Both lungs are clear. The visualized skeletal structures are
unremarkable.
IMPRESSION: No active disease.
